# Patient Record
Sex: Female | Born: 1990 | Race: Black or African American | Hispanic: No | Marital: Single | Smoking: Never smoker
Health system: Southern US, Community
[De-identification: ages and names within clinical notes are randomized; demographics above are authoritative.]

## PROBLEM LIST (undated history)

## (undated) DIAGNOSIS — J45909 Unspecified asthma, uncomplicated: Secondary | ICD-10-CM

## (undated) DIAGNOSIS — E7800 Pure hypercholesterolemia, unspecified: Secondary | ICD-10-CM

## (undated) HISTORY — PX: EYE SURGERY: SHX253

---

## 2015-11-12 ENCOUNTER — Emergency Department (HOSPITAL_BASED_OUTPATIENT_CLINIC_OR_DEPARTMENT_OTHER): Payer: Medicare Other

## 2015-11-12 ENCOUNTER — Encounter (HOSPITAL_BASED_OUTPATIENT_CLINIC_OR_DEPARTMENT_OTHER): Payer: Self-pay | Admitting: *Deleted

## 2015-11-12 ENCOUNTER — Emergency Department (HOSPITAL_BASED_OUTPATIENT_CLINIC_OR_DEPARTMENT_OTHER)
Admission: EM | Admit: 2015-11-12 | Discharge: 2015-11-13 | Disposition: A | Discharge status: Home / Self Care | Payer: Medicare Other | Attending: Emergency Medicine | Admitting: Emergency Medicine

## 2015-11-12 DIAGNOSIS — J45901 Unspecified asthma with (acute) exacerbation: Secondary | ICD-10-CM | POA: Insufficient documentation

## 2015-11-12 DIAGNOSIS — R600 Localized edema: Secondary | ICD-10-CM | POA: Insufficient documentation

## 2015-11-12 DIAGNOSIS — E7800 Pure hypercholesterolemia, unspecified: Secondary | ICD-10-CM | POA: Diagnosis not present

## 2015-11-12 DIAGNOSIS — Z7951 Long term (current) use of inhaled steroids: Secondary | ICD-10-CM | POA: Diagnosis not present

## 2015-11-12 DIAGNOSIS — R05 Cough: Secondary | ICD-10-CM | POA: Diagnosis present

## 2015-11-12 HISTORY — DX: Pure hypercholesterolemia, unspecified: E78.00

## 2015-11-12 HISTORY — DX: Unspecified asthma, uncomplicated: J45.909

## 2015-11-12 LAB — CBG MONITORING, ED: Glucose-Capillary: 89 mg/dL (ref 65–99)

## 2015-11-12 MED ORDER — IPRATROPIUM BROMIDE 0.02 % IN SOLN
RESPIRATORY_TRACT | Status: AC
  Filled 2015-11-12: qty 2.5

## 2015-11-12 MED ORDER — METOCLOPRAMIDE HCL 10 MG PO TABS
10.0000 mg | ORAL_TABLET | Freq: Once | ORAL | Status: AC
  Administered 2015-11-12: 10 mg via ORAL
  Filled 2015-11-12: qty 1

## 2015-11-12 MED ORDER — ALBUTEROL (5 MG/ML) CONTINUOUS INHALATION SOLN
10.0000 mg/h | INHALATION_SOLUTION | RESPIRATORY_TRACT | Status: DC
  Administered 2015-11-12: 10 mg/h via RESPIRATORY_TRACT
  Filled 2015-11-12: qty 20

## 2015-11-12 MED ORDER — PREDNISONE 50 MG PO TABS
60.0000 mg | ORAL_TABLET | Freq: Once | ORAL | Status: AC
  Administered 2015-11-12: 60 mg via ORAL
  Filled 2015-11-12: qty 1

## 2015-11-12 MED ORDER — KETOROLAC TROMETHAMINE 60 MG/2ML IM SOLN
60.0000 mg | Freq: Once | INTRAMUSCULAR | Status: AC
  Administered 2015-11-12: 60 mg via INTRAMUSCULAR
  Filled 2015-11-12: qty 2

## 2015-11-12 MED ORDER — IPRATROPIUM BROMIDE 0.02 % IN SOLN
1.0000 mg | Freq: Once | RESPIRATORY_TRACT | Status: AC
  Administered 2015-11-12: 1 mg via RESPIRATORY_TRACT
  Filled 2015-11-12: qty 5

## 2015-11-12 NOTE — ED Provider Notes (Signed)
CSN: [id number]     Arrival date & time 11/12/15  2209 History   By signing my name below, I, Arianna Nassar and Myles GipKenneth Lowery, attest that this documentation has been prepared under the direction and in the presence of Tomasita CrumbleAdeleke Courtnee Myer, MD. Electronically Signed: Octavia HeirArianna Nassar, ED Scribe. 11/12/2015. 11:05 PM.    Chief Complaint  Patient presents with  . Cough      The history is provided by the patient. No language interpreter was used.   HPI Comments: Griselda MinerGabrell Jerez is a 25 y.o. female with a PMHx of Asthma, COPD, HTN, and Lymphedema who presents to the Emergency Department complaining of sudden onset, gradual worsening, moderate, productive cough that brings up mucus like sputum onset 3 days ago. She reports associated symptoms of pressure like headache, bilateral ear popping, diaphoresis at night, and wheezing. Pt reports using her inhaler at home to alleviate her symptoms with relief.The pt denies SOB and sore throat.   Past Medical History  Diagnosis Date  . High cholesterol   . Asthma    Past Surgical History  Procedure Laterality Date  . Eye surgery     No family history on file. Social History  Substance Use Topics  . Smoking status: Never Smoker   . Smokeless tobacco: None  . Alcohol Use: No   OB History    No data available     Review of Systems    A complete 10 system review of systems was obtained and all systems are negative except as noted in the HPI and PMH.    Allergies  Review of patient's allergies indicates no known allergies.  Home Medications   Prior to Admission medications   Medication Sig Start Date End Date Taking? Authorizing Provider  ALBUTEROL IN Inhale into the lungs.   Yes Historical Provider, MD   Triage vitals: BP 125/86 mmHg  Pulse 90  Temp(Src) 98 F (36.7 C) (Oral)  Resp 18  Ht 5' 7.5" (1.715 m)  Wt 299 lb (135.626 kg)  BMI 46.11 kg/m2  SpO2 96%  LMP 11/05/2015 Physical Exam  Constitutional: She is oriented to person, place,  and time. She appears well-developed and well-nourished. No distress.  HENT:  Head: Normocephalic and atraumatic.  Nose: Nose normal.  Mouth/Throat: Oropharynx is clear and moist. No oropharyngeal exudate.  Eyes: Conjunctivae and EOM are normal. Pupils are equal, round, and reactive to light. No scleral icterus.  Neck: Normal range of motion. Neck supple. No JVD present. No tracheal deviation present. No thyromegaly present.  Cardiovascular: Normal rate, regular rhythm and normal heart sounds.  Exam reveals no gallop and no friction rub.   No murmur heard. Pulmonary/Chest: Effort normal. No respiratory distress. She has wheezes. She exhibits no tenderness.  Wheezing bilateral lung fields   Abdominal: Soft. Bowel sounds are normal. She exhibits no distension and no mass. There is no tenderness. There is no rebound and no guarding.  Musculoskeletal: Normal range of motion. She exhibits edema. She exhibits no tenderness.  Bilateral lymphedema in lower extremites  Lymphadenopathy:    She has no cervical adenopathy.  Neurological: She is alert and oriented to person, place, and time. No cranial nerve deficit. She exhibits normal muscle tone.  Skin: Skin is warm and dry. No rash noted. No erythema. No pallor.  Nursing note and vitals reviewed.   ED Course  Procedures  DIAGNOSTIC STUDIES: Oxygen Saturation is 96% on RA, Normal by my interpretation.  COORDINATION OF CARE:  11:05 PM Will order chest X-Ray.  Discussed treatment plan with pt at bedside and pt agreed to plan.  Labs Review Labs Reviewed  CBG MONITORING, ED    Imaging Review Dg Chest 2 View  11/12/2015  CLINICAL DATA:  Cough and congestion for 2 days EXAM: CHEST  2 VIEW COMPARISON:  None. FINDINGS: Cardiac shadow is within normal limits. Mild peribronchial changes are noted which may be related to viral etiology or reactive airways disease. No focal confluent infiltrate is seen. No bony abnormality is noted. IMPRESSION: Increased  peribronchial markings bilaterally. No focal confluent infiltrate is seen. Electronically Signed   By: Alcide Clever M.D.   On: 11/12/2015 23:49   I have personally reviewed and evaluated these images and lab results as part of my medical decision-making.   EKG Interpretation None      MDM   Final diagnoses:  None   Patient presents to the ED for worsening cough and SOB. PE reveals wheezing, this is likely an asthma exacerbation. She was given albuterol, ipratropium, and prednisone for treatment. Will obtain CXR for longevity of her symptoms to evaluate for pneumonia.  She was given toradol and reglan for headache.  2:00 AM Upon repeat evaluation, wheezing has stopped. Patient feels back to baseline. Will DC with prednisone and tessalon pearles for home use.  PCP fu advised. She has albuterol inhaler at home. She appears well and in NAD. VS remain within her normal limits and she is safe for DC.    I personally performed the services described in this documentation, which was scribed in my presence. The recorded information has been reviewed and is accurate.     Tomasita Crumble, MD 11/13/15 0201

## 2015-11-12 NOTE — ED Notes (Addendum)
Cough and cold symptoms x 2 days. She wants her blood sugar checked because it has been running 200 at home. She has not been diagnosed with diabetes.

## 2015-11-12 NOTE — Discharge Instructions (Signed)
Asthma Attack Prevention Ms. Vought, your chest xray does not show any pneumonia.  Continue to use your albuterol inhaler 2 puffs every 6 hours for the next 2 days, then only use as needed.  Take prednisone for the next 4 days to complete your treatment. See your primary care doctor within 3 days for close follow up. If symptoms worsen, come back to the ED immediately. Thank you. While you may not be able to control the fact that you have asthma, you can take actions to prevent asthma attacks. The best way to prevent asthma attacks is to maintain good control of your asthma. You can achieve this by:  Taking your medicines as directed.  Avoiding things that can irritate your airways or make your asthma symptoms worse (asthma triggers).  Keeping track of how well your asthma is controlled and of any changes in your symptoms.  Responding quickly to worsening asthma symptoms (asthma attack).  Seeking emergency care when it is needed. WHAT ARE SOME WAYS TO PREVENT AN ASTHMA ATTACK? Have a Plan Work with your health care provider to create a written plan for managing and treating your asthma attacks (asthma action plan). This plan includes:  A list of your asthma triggers and how you can avoid them.  Information on when medicines should be taken and when their dosages should be changed.  The use of a device that measures how well your lungs are working (peak flow meter). Monitor Your Asthma Use your peak flow meter and record your results in a journal every day. A drop in your peak flow numbers on one or more days may indicate the start of an asthma attack. This can happen even before you start to feel symptoms. You can prevent an asthma attack from getting worse by following the steps in your asthma action plan. Avoid Asthma Triggers Work with your asthma health care provider to find out what your asthma triggers are. This can be done by:  Allergy testing.  Keeping a journal that notes when  asthma attacks occur and the factors that may have contributed to them.  Determining if there are other medical conditions that are making your asthma worse. Once you have determined your asthma triggers, take steps to avoid them. This may include avoiding excessive or prolonged exposure to:  Dust. Have someone dust and vacuum your home for you once or twice a week. Using a high-efficiency particulate arrestance (HEPA) vacuum is best.  Smoke. This includes campfire smoke, forest fire smoke, and secondhand smoke from tobacco products.  Pet dander. Avoid contact with animals that you know you are allergic to.  Allergens from trees, grasses or pollens. Avoid spending a lot of time outdoors when pollen counts are high, and on very windy days.  Very cold, dry, or humid air.  Mold.  Foods that contain high amounts of sulfites.  Strong odors.  Outdoor air pollutants, such as Museum/gallery exhibitions officer.  Indoor air pollutants, such as aerosol sprays and fumes from household cleaners.  Household pests, including dust mites and cockroaches, and pest droppings.  Certain medicines, including NSAIDs. Always talk to your health care provider before stopping or starting any new medicines. Medicines Take over-the-counter and prescription medicines only as told by your health care provider. Many asthma attacks can be prevented by carefully following your medicine schedule. Taking your medicines correctly is especially important when you cannot avoid certain asthma triggers. Act Quickly If an asthma attack does happen, acting quickly can decrease how severe it is  and how long it lasts. Take these steps:   Pay attention to your symptoms. If you are coughing, wheezing, or having difficulty breathing, do not wait to see if your symptoms go away on their own. Follow your asthma action plan.  If you have followed your asthma action plan and your symptoms are not improving, call your health care provider or seek  immediate medical care at the nearest hospital. It is important to note how often you need to use your fast-acting rescue inhaler. If you are using your rescue inhaler more often, it may mean that your asthma is not under control. Adjusting your asthma treatment plan may help you to prevent future asthma attacks and help you to gain better control of your condition. HOW CAN I PREVENT AN ASTHMA ATTACK WHEN I EXERCISE? Follow advice from your health care provider about whether you should use your fast-acting inhaler before exercising. Many people with asthma experience exercise-induced bronchoconstriction (EIB). This condition often worsens during vigorous exercise in cold, humid, or dry environments. Usually, people with EIB can stay very active by pre-treating with a fast-acting inhaler before exercising.   This information is not intended to replace advice given to you by your health care provider. Make sure you discuss any questions you have with your health care provider.   Document Released: 05/13/2009 Document Revised: 02/13/2015 Document Reviewed: 10/25/2014 [employer] Interactive Patient Education [employer]

## 2015-11-13 DIAGNOSIS — J45901 Unspecified asthma with (acute) exacerbation: Secondary | ICD-10-CM | POA: Diagnosis not present

## 2015-11-13 MED ORDER — PREDNISONE 20 MG PO TABS: 60.0000 mg | ORAL_TABLET | Freq: Every day | ORAL | Status: AC

## 2015-11-13 MED ORDER — BENZONATATE 100 MG PO CAPS
200.0000 mg | ORAL_CAPSULE | Freq: Once | ORAL | Status: AC
  Administered 2015-11-13: 200 mg via ORAL
  Filled 2015-11-13: qty 2

## 2015-11-13 MED ORDER — BENZONATATE 100 MG PO CAPS: 100.0000 mg | ORAL_CAPSULE | Freq: Three times a day (TID) | ORAL | Status: AC | PRN

## 2015-11-13 MED ORDER — BENZONATATE 100 MG PO CAPS
ORAL_CAPSULE | ORAL | Status: AC
  Administered 2015-11-13: 200 mg
  Filled 2015-11-13: qty 1

## 2016-10-06 IMAGING — DX DG CHEST 2V
2 series · 2 of 2 positions shown · non-contrast
Comparison: None.

CLINICAL DATA: Cough and congestion for 2 days

EXAM:
CHEST  2 VIEW

[chest pa]
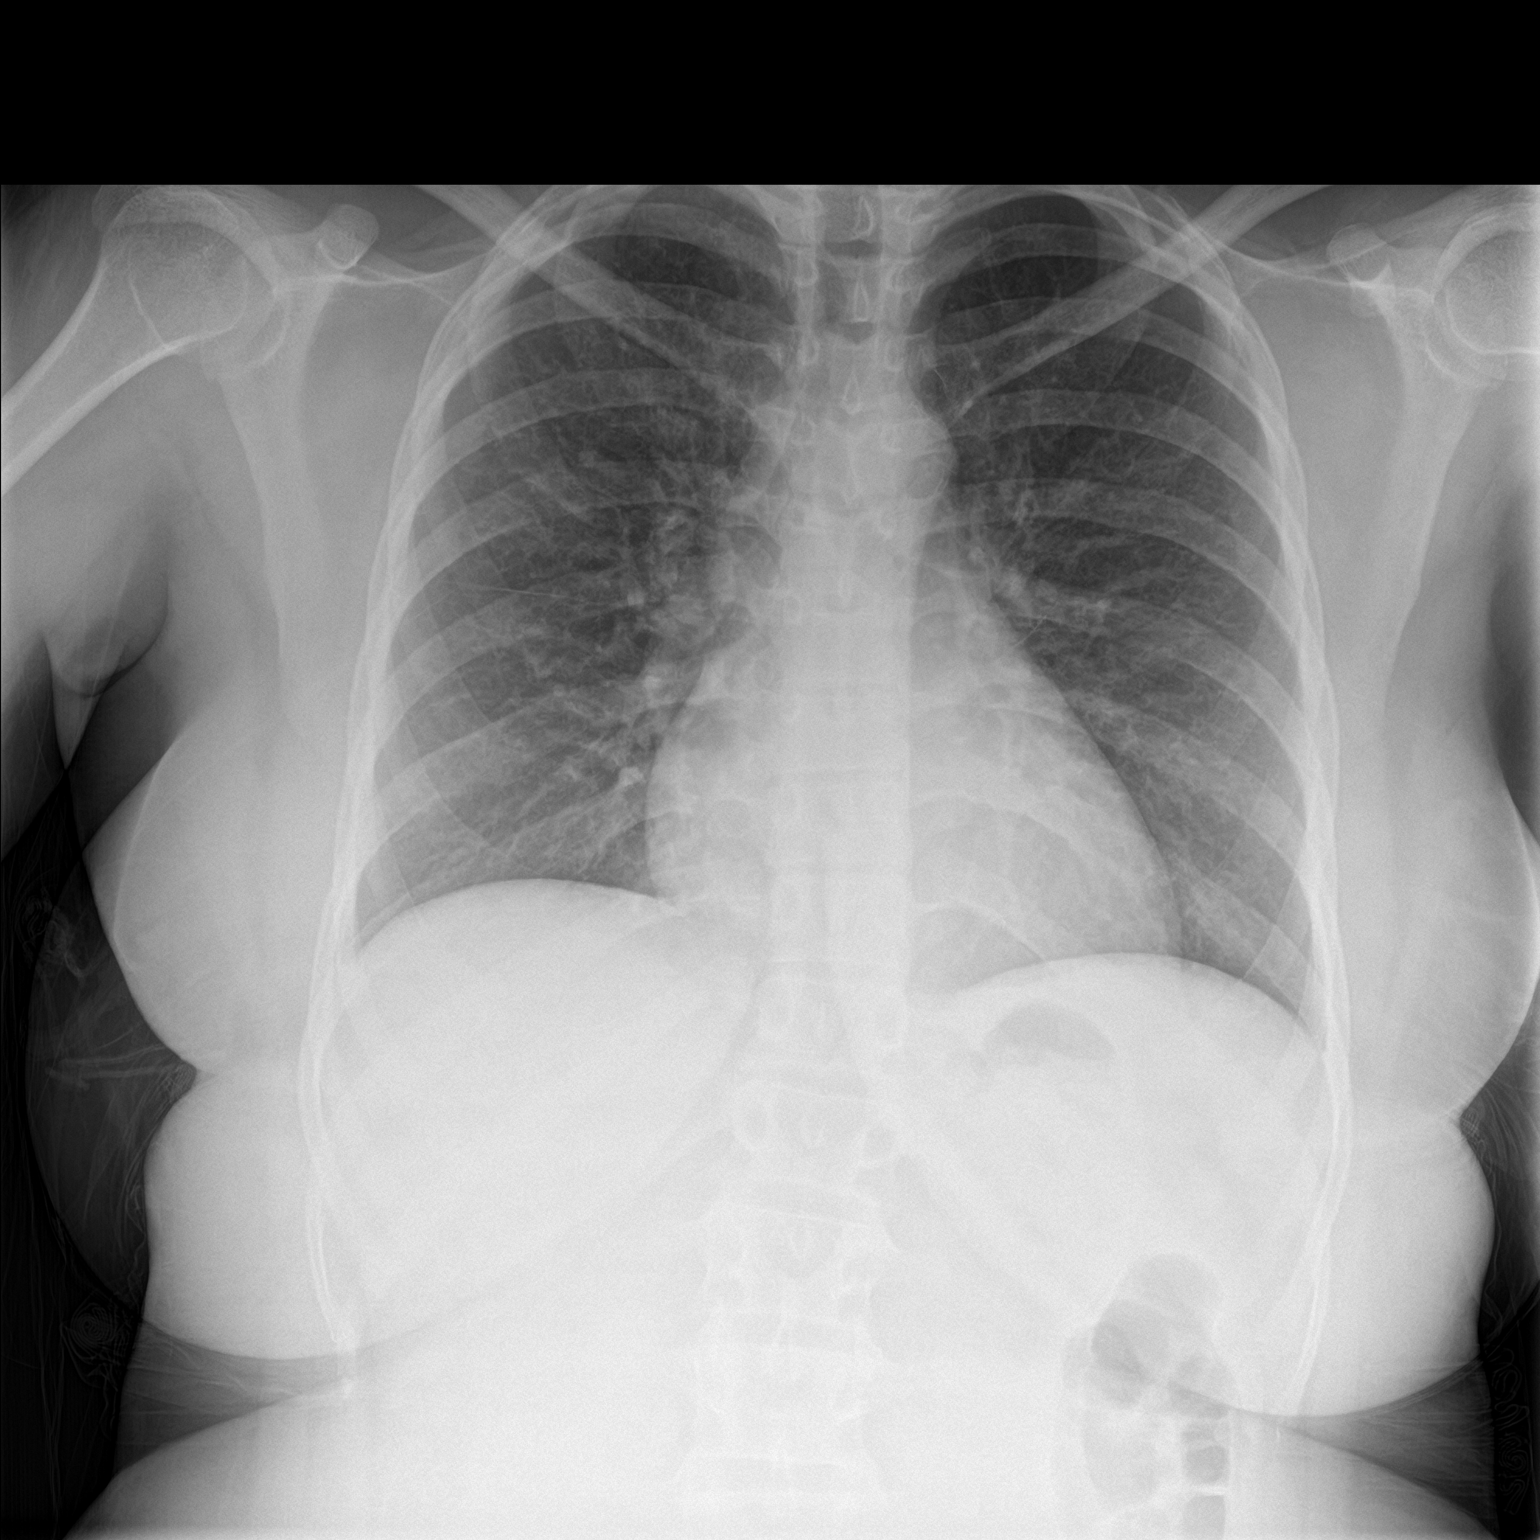

[chest lat]
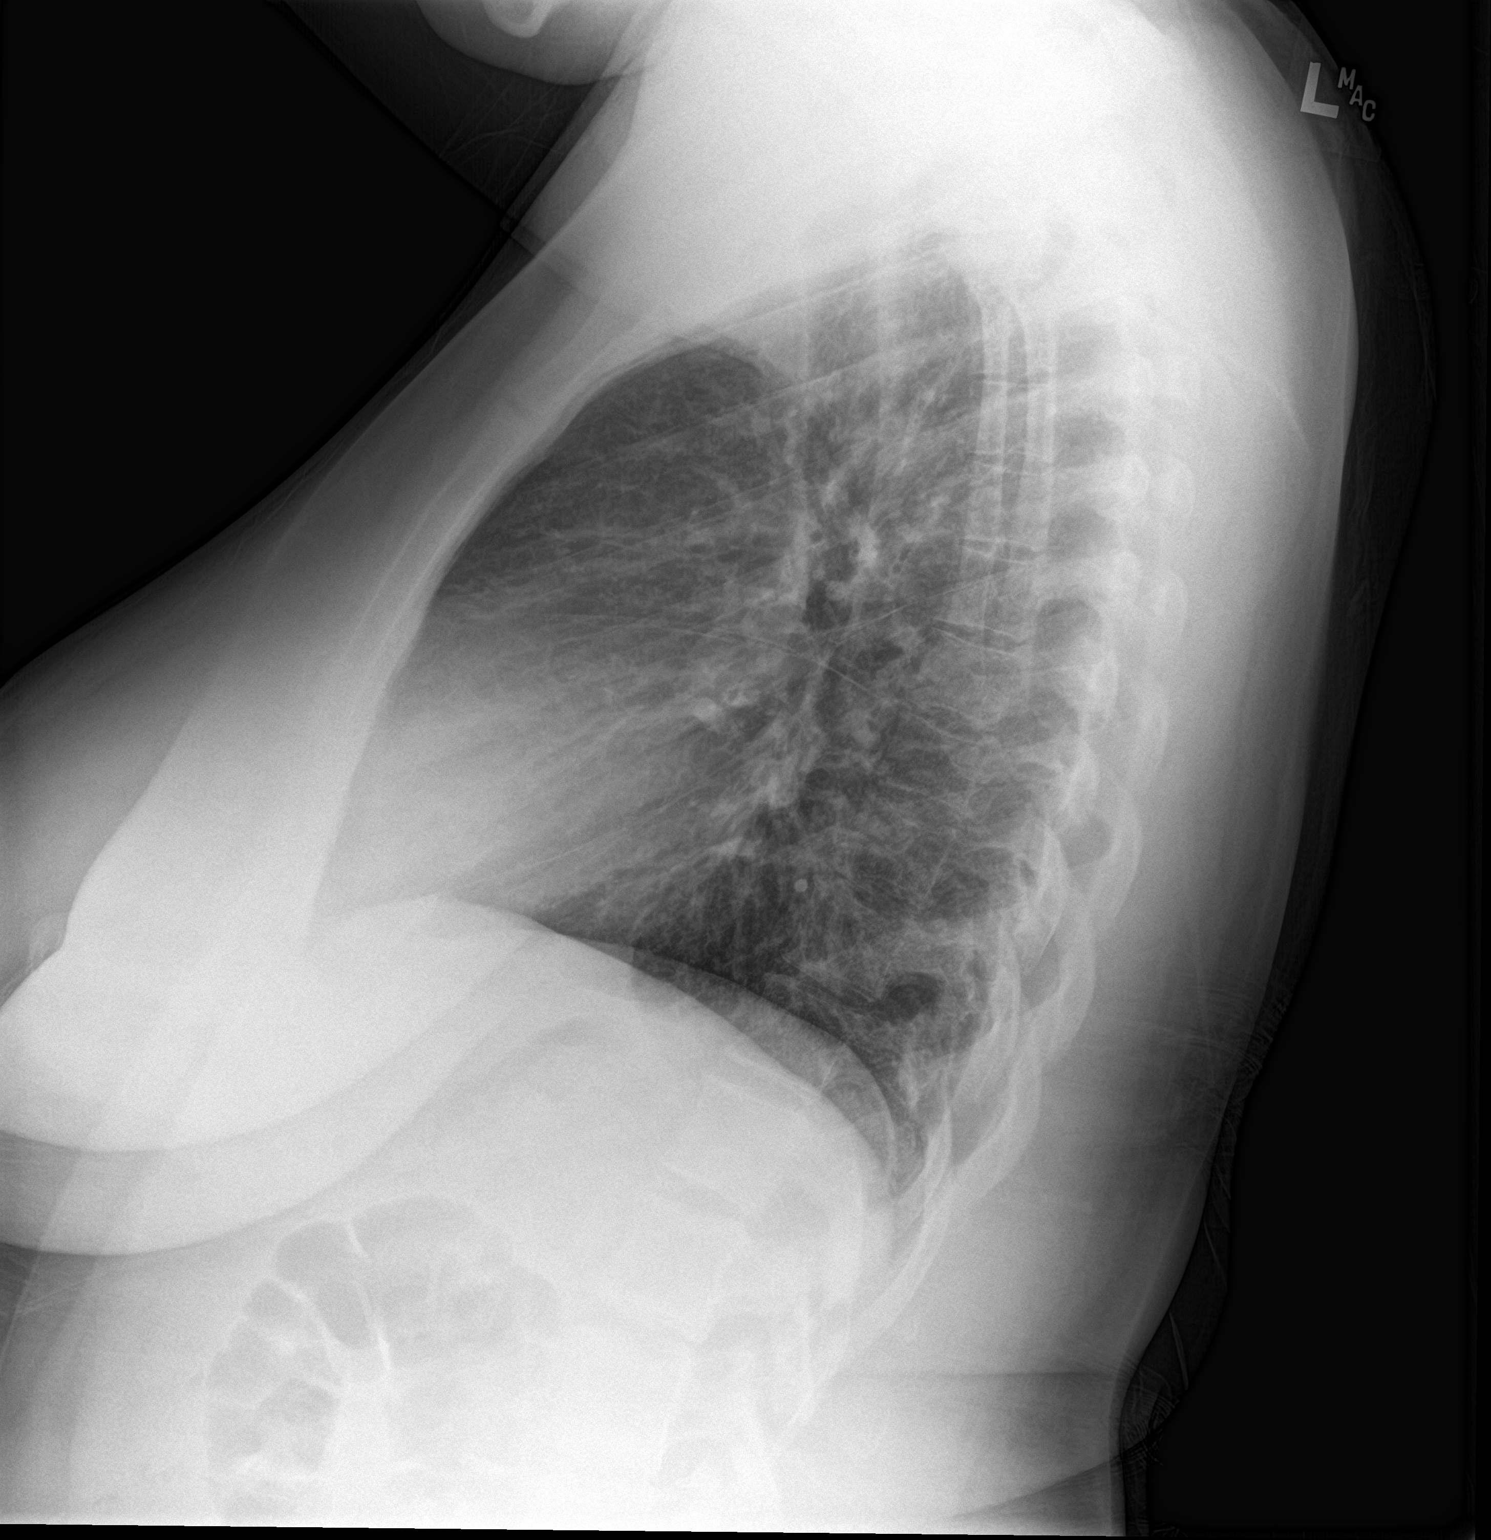

[2 of 2 positions shown; findings below may reference images not displayed]

FINDINGS: Cardiac shadow is within normal limits. Mild peribronchial changes
are noted which may be related to viral etiology or reactive airways
disease. No focal confluent infiltrate is seen. No bony abnormality
is noted.
IMPRESSION: Increased peribronchial markings bilaterally. No focal confluent
infiltrate is seen.

## 2017-03-08 DEATH — deceased
